# Patient Record
Sex: Female | Born: 1960 | Race: Black or African American | Hispanic: No | Marital: Married | Smoking: Never smoker
Health system: Southern US, Community
[De-identification: ages and names within clinical notes are randomized; demographics above are authoritative.]

## PROBLEM LIST (undated history)

## (undated) DIAGNOSIS — I10 Essential (primary) hypertension: Secondary | ICD-10-CM

## (undated) DIAGNOSIS — T7840XA Allergy, unspecified, initial encounter: Secondary | ICD-10-CM

## (undated) DIAGNOSIS — H269 Unspecified cataract: Secondary | ICD-10-CM

## (undated) DIAGNOSIS — M1990 Unspecified osteoarthritis, unspecified site: Secondary | ICD-10-CM

## (undated) HISTORY — DX: Essential (primary) hypertension: I10

## (undated) HISTORY — PX: BREAST BIOPSY: SHX20

## (undated) HISTORY — PX: TOTAL ABDOMINAL HYSTERECTOMY: SHX209

## (undated) HISTORY — DX: Allergy, unspecified, initial encounter: T78.40XA

## (undated) HISTORY — DX: Unspecified cataract: H26.9

## (undated) HISTORY — DX: Unspecified osteoarthritis, unspecified site: M19.90

---

## 2004-12-06 ENCOUNTER — Encounter: Admission: RE | Admit: 2004-12-06 | Discharge: 2004-12-06 | Payer: Self-pay | Admitting: Internal Medicine

## 2005-12-24 ENCOUNTER — Encounter: Admission: RE | Admit: 2005-12-24 | Discharge: 2005-12-24 | Payer: Self-pay | Admitting: Internal Medicine

## 2007-01-02 ENCOUNTER — Encounter: Admission: RE | Admit: 2007-01-02 | Discharge: 2007-01-02 | Payer: Self-pay | Admitting: Internal Medicine

## 2008-01-08 ENCOUNTER — Encounter: Admission: RE | Admit: 2008-01-08 | Discharge: 2008-01-08 | Payer: Self-pay | Admitting: Internal Medicine

## 2009-01-11 ENCOUNTER — Encounter: Admission: RE | Admit: 2009-01-11 | Discharge: 2009-01-11 | Payer: Self-pay | Admitting: Internal Medicine

## 2009-01-20 ENCOUNTER — Encounter: Admission: RE | Admit: 2009-01-20 | Discharge: 2009-01-20 | Payer: Self-pay | Admitting: Internal Medicine

## 2010-01-13 ENCOUNTER — Encounter: Admission: RE | Admit: 2010-01-13 | Discharge: 2010-01-13 | Payer: Self-pay | Admitting: Internal Medicine

## 2010-10-26 ENCOUNTER — Emergency Department (HOSPITAL_COMMUNITY)
Admission: EM | Admit: 2010-10-26 | Discharge: 2010-10-26 | Payer: Self-pay | Source: Home / Self Care | Admitting: Family Medicine

## 2010-11-26 ENCOUNTER — Encounter: Payer: Self-pay | Admitting: Internal Medicine

## 2011-01-12 ENCOUNTER — Other Ambulatory Visit: Payer: Self-pay | Admitting: Internal Medicine

## 2011-01-12 DIAGNOSIS — Z1231 Encounter for screening mammogram for malignant neoplasm of breast: Secondary | ICD-10-CM

## 2011-01-19 ENCOUNTER — Ambulatory Visit
Admission: RE | Admit: 2011-01-19 | Discharge: 2011-01-19 | Disposition: A | Discharge status: Home / Self Care | Payer: BC Managed Care – PPO | Source: Ambulatory Visit | Attending: Internal Medicine | Admitting: Internal Medicine

## 2011-01-19 DIAGNOSIS — Z1231 Encounter for screening mammogram for malignant neoplasm of breast: Secondary | ICD-10-CM

## 2012-01-31 ENCOUNTER — Other Ambulatory Visit: Payer: Self-pay | Admitting: Internal Medicine

## 2012-01-31 DIAGNOSIS — Z1231 Encounter for screening mammogram for malignant neoplasm of breast: Secondary | ICD-10-CM

## 2012-02-04 ENCOUNTER — Ambulatory Visit
Admission: RE | Admit: 2012-02-04 | Discharge: 2012-02-04 | Disposition: A | Discharge status: Home / Self Care | Payer: BC Managed Care – PPO | Source: Ambulatory Visit | Attending: Internal Medicine | Admitting: Internal Medicine

## 2012-02-04 DIAGNOSIS — Z1231 Encounter for screening mammogram for malignant neoplasm of breast: Secondary | ICD-10-CM

## 2012-05-13 ENCOUNTER — Encounter: Payer: Self-pay | Admitting: Internal Medicine

## 2012-06-12 ENCOUNTER — Ambulatory Visit (AMBULATORY_SURGERY_CENTER): Payer: BC Managed Care – PPO

## 2012-06-12 VITALS — Ht 66.0 in | Wt 230.2 lb

## 2012-06-12 DIAGNOSIS — Z83719 Family history of colon polyps, unspecified: Secondary | ICD-10-CM

## 2012-06-12 DIAGNOSIS — Z1211 Encounter for screening for malignant neoplasm of colon: Secondary | ICD-10-CM

## 2012-06-12 DIAGNOSIS — Z8371 Family history of colonic polyps: Secondary | ICD-10-CM

## 2012-06-12 MED ORDER — MOVIPREP 100 G PO SOLR: ORAL | Status: DC

## 2012-06-13 ENCOUNTER — Encounter: Payer: Self-pay | Admitting: Gastroenterology

## 2012-06-18 ENCOUNTER — Encounter: Payer: BC Managed Care – PPO | Admitting: Gastroenterology

## 2012-06-24 ENCOUNTER — Encounter: Payer: BC Managed Care – PPO | Admitting: Internal Medicine

## 2012-12-10 ENCOUNTER — Encounter: Payer: Self-pay | Admitting: Internal Medicine

## 2013-01-16 ENCOUNTER — Encounter: Payer: Self-pay | Admitting: Internal Medicine

## 2013-01-16 ENCOUNTER — Other Ambulatory Visit: Payer: Self-pay

## 2013-01-16 ENCOUNTER — Ambulatory Visit (AMBULATORY_SURGERY_CENTER): Payer: BC Managed Care – PPO | Admitting: *Deleted

## 2013-01-16 VITALS — Ht 66.0 in | Wt 214.0 lb

## 2013-01-16 DIAGNOSIS — Z1211 Encounter for screening for malignant neoplasm of colon: Secondary | ICD-10-CM

## 2013-01-16 DIAGNOSIS — Z1231 Encounter for screening mammogram for malignant neoplasm of breast: Secondary | ICD-10-CM

## 2013-01-16 MED ORDER — MOVIPREP 100 G PO SOLR: ORAL | Status: DC

## 2013-01-22 ENCOUNTER — Telehealth: Payer: Self-pay | Admitting: Internal Medicine

## 2013-01-22 NOTE — Telephone Encounter (Signed)
Returned call to patient. She reports that she drank some raspberry tea this morning and when she went to pour it out she realized it was red. Informed her just not to drink anymore and that she would be fine to keep her appointment tomorrow.

## 2013-01-23 ENCOUNTER — Encounter: Payer: Self-pay | Admitting: Internal Medicine

## 2013-01-23 ENCOUNTER — Ambulatory Visit (AMBULATORY_SURGERY_CENTER): Payer: BC Managed Care – PPO | Admitting: Internal Medicine

## 2013-01-23 ENCOUNTER — Encounter: Payer: BC Managed Care – PPO | Admitting: Internal Medicine

## 2013-01-23 VITALS — BP 143/79 | HR 72 | Temp 97.5°F | Resp 14 | Ht 66.0 in | Wt 214.0 lb

## 2013-01-23 DIAGNOSIS — Z1211 Encounter for screening for malignant neoplasm of colon: Secondary | ICD-10-CM

## 2013-01-23 MED ORDER — SODIUM CHLORIDE 0.9 % IV SOLN: 500.0000 mL | INTRAVENOUS | Status: DC

## 2013-01-23 NOTE — Patient Instructions (Addendum)
YOU HAD AN ENDOSCOPIC PROCEDURE TODAY AT THE [facility]: Refer to the procedure report that was given to you for any specific questions about what was found during the examination.  If the procedure report does not answer your questions, please call your gastroenterologist to clarify.  If you requested that your care partner not be given the details of your procedure findings, then the procedure report has been included in a sealed envelope for you to review at your convenience later.  YOU SHOULD EXPECT: Some feelings of bloating in the abdomen. Passage of more gas than usual.  Walking can help get rid of the air that was put into your GI tract during the procedure and reduce the bloating. If you had a lower endoscopy (such as a colonoscopy or flexible sigmoidoscopy) you may notice spotting of blood in your stool or on the toilet paper. If you underwent a bowel prep for your procedure, then you may not have a normal bowel movement for a few days.  DIET: Your first meal following the procedure should be a light meal and then it is ok to progress to your normal diet.  A half-sandwich or bowl of soup is an example of a good first meal.  Heavy or fried foods are harder to digest and may make you feel nauseous or bloated.  Likewise meals heavy in dairy and vegetables can cause extra gas to form and this can also increase the bloating.  Drink plenty of fluids but you should avoid alcoholic beverages for 24 hours.  ACTIVITY: Your care partner should take you home directly after the procedure.  You should plan to take it easy, moving slowly for the rest of the day.  You can resume normal activity the day after the procedure however you should NOT DRIVE or use heavy machinery for 24 hours (because of the sedation medicines used during the test).    SYMPTOMS TO REPORT IMMEDIATELY: A gastroenterologist can be reached at any hour.  During normal business hours, 8:30 AM to 5:00 PM Monday through Friday,  call [phone].  After hours and on weekends, please call the GI answering service at [phone] who will take a message and have the physician on call contact you.   Following lower endoscopy (colonoscopy or flexible sigmoidoscopy):  Excessive amounts of blood in the stool  Significant tenderness or worsening of abdominal pains  Swelling of the abdomen that is new, acute  Fever of 100F or higher       FOLLOW UP: If any biopsies were taken you will be contacted by phone or by letter within the next 1-3 weeks.  Call your gastroenterologist if you have not heard about the biopsies in 3 weeks.  Our staff will call the home number listed on your records the next business day following your procedure to check on you and address any questions or concerns that you may have at that time regarding the information given to you following your procedure. This is a courtesy call and so if there is no answer at the home number and we have not heard from you through the emergency physician on call, we will assume that you have returned to your regular daily activities without incident.  SIGNATURES/CONFIDENTIALITY: You and/or your care partner have signed paperwork which will be entered into your electronic medical record.  These signatures attest to the fact that that the information above on your After Visit Summary has been reviewed and is understood.  Full responsibility  of the confidentiality of this discharge information lies with you and/or your care-partner.  Next colonoscopy 10 years  2024  High fiber diet information given.

## 2013-01-23 NOTE — Op Note (Signed)
[facility] [address] [place], [address]   COLONOSCOPY PROCEDURE REPORT  PATIENT: Carrie Hendrix, Carrie Hendrix  MR#: [id number] BIRTHDATE: Jul 29, 1961 , 52  yrs. old GENDER: Female ENDOSCOPIST: Hart Carwin, MD REFERRED BY:  Kellie Shropshire, M.D. PROCEDURE DATE:  01/23/2013 PROCEDURE:   Colonoscopy, screening ASA CLASS:   Class I INDICATIONS:Patient's family history of colon polyps. MEDICATIONS: MAC sedation, administered by CRNA and Propofol (Diprivan) 240 mg IV  DESCRIPTION OF PROCEDURE:   After the risks and benefits and of the procedure were explained, informed consent was obtained.  A digital rectal exam revealed no abnormalities of the rectum.    The endoscope was introduced through the anus and advanced to the cecum, which was identified by both the appendix and ileocecal valve .  The quality of the prep was excellent, using MoviPrep . The instrument was then slowly withdrawn as the colon was fully examined.     COLON FINDINGS: A normal appearing cecum, ileocecal valve, and appendiceal orifice were identified.  The ascending, hepatic flexure, transverse, splenic flexure, descending, sigmoid colon and rectum appeared unremarkable.  No polyps or cancers were seen. Retroflexed views revealed no abnormalities.     The scope was then withdrawn from the patient and the procedure completed.  COMPLICATIONS: There were no complications. ENDOSCOPIC IMPRESSION: Normal colon  RECOMMENDATIONS: High fiber diet   REPEAT EXAM: In 10 year(s)  for Colonoscopy.  cc:  _______________________________ eSignedHart Carwin, MD 01/23/2013 9:04 AM

## 2013-01-23 NOTE — Progress Notes (Signed)
Report to pacu RN, vss, bbs=clear 

## 2013-01-23 NOTE — Progress Notes (Signed)
Patient did not experience any of the following events: a burn prior to discharge; a fall within the facility; wrong site/side/patient/procedure/implant event; or a hospital transfer or hospital admission upon discharge from the facility. (G8907) Patient did not have preoperative order for IV antibiotic SSI prophylaxis. (G8918)  

## 2013-01-26 ENCOUNTER — Telehealth: Payer: Self-pay | Admitting: *Deleted

## 2013-01-26 NOTE — Telephone Encounter (Signed)
  Follow up Call-  Call back number 01/23/2013  Post procedure Call Back phone  # [phone]  Permission to leave phone message Yes     Patient questions:  Message left to call us if necessary.

## 2013-02-09 ENCOUNTER — Ambulatory Visit: Payer: BC Managed Care – PPO

## 2013-02-13 ENCOUNTER — Ambulatory Visit
Admission: RE | Admit: 2013-02-13 | Discharge: 2013-02-13 | Disposition: A | Discharge status: Home / Self Care | Payer: BC Managed Care – PPO | Source: Ambulatory Visit

## 2013-02-13 DIAGNOSIS — Z1231 Encounter for screening mammogram for malignant neoplasm of breast: Secondary | ICD-10-CM

## 2013-02-16 ENCOUNTER — Other Ambulatory Visit: Payer: Self-pay | Admitting: Internal Medicine

## 2013-02-16 DIAGNOSIS — R928 Other abnormal and inconclusive findings on diagnostic imaging of breast: Secondary | ICD-10-CM

## 2013-03-03 ENCOUNTER — Ambulatory Visit
Admission: RE | Admit: 2013-03-03 | Discharge: 2013-03-03 | Disposition: A | Discharge status: Home / Self Care | Payer: BC Managed Care – PPO | Source: Ambulatory Visit | Attending: Internal Medicine | Admitting: Internal Medicine

## 2013-03-03 DIAGNOSIS — R928 Other abnormal and inconclusive findings on diagnostic imaging of breast: Secondary | ICD-10-CM

## 2014-02-08 ENCOUNTER — Other Ambulatory Visit: Payer: Self-pay

## 2014-02-08 DIAGNOSIS — Z1231 Encounter for screening mammogram for malignant neoplasm of breast: Secondary | ICD-10-CM

## 2014-03-04 ENCOUNTER — Ambulatory Visit
Admission: RE | Admit: 2014-03-04 | Discharge: 2014-03-04 | Disposition: A | Discharge status: Home / Self Care | Payer: BC Managed Care – PPO | Source: Ambulatory Visit

## 2014-03-04 ENCOUNTER — Encounter (INDEPENDENT_AMBULATORY_CARE_PROVIDER_SITE_OTHER): Payer: Self-pay

## 2014-03-04 DIAGNOSIS — Z1231 Encounter for screening mammogram for malignant neoplasm of breast: Secondary | ICD-10-CM

## 2015-02-10 ENCOUNTER — Other Ambulatory Visit: Payer: Self-pay

## 2015-02-10 DIAGNOSIS — Z1231 Encounter for screening mammogram for malignant neoplasm of breast: Secondary | ICD-10-CM

## 2015-03-14 ENCOUNTER — Ambulatory Visit: Payer: BC Managed Care – PPO

## 2015-03-25 ENCOUNTER — Ambulatory Visit
Admission: RE | Admit: 2015-03-25 | Discharge: 2015-03-25 | Disposition: A | Discharge status: Home / Self Care | Payer: BC Managed Care – PPO | Source: Ambulatory Visit

## 2015-03-25 DIAGNOSIS — Z1231 Encounter for screening mammogram for malignant neoplasm of breast: Secondary | ICD-10-CM

## 2016-02-23 ENCOUNTER — Other Ambulatory Visit: Payer: Self-pay

## 2016-02-23 DIAGNOSIS — Z1231 Encounter for screening mammogram for malignant neoplasm of breast: Secondary | ICD-10-CM

## 2016-03-26 ENCOUNTER — Ambulatory Visit
Admission: RE | Admit: 2016-03-26 | Discharge: 2016-03-26 | Disposition: A | Discharge status: Home / Self Care | Payer: BC Managed Care – PPO | Source: Ambulatory Visit

## 2016-03-26 DIAGNOSIS — Z1231 Encounter for screening mammogram for malignant neoplasm of breast: Secondary | ICD-10-CM

## 2017-02-20 ENCOUNTER — Other Ambulatory Visit: Payer: Self-pay | Admitting: Internal Medicine

## 2017-02-20 DIAGNOSIS — Z1231 Encounter for screening mammogram for malignant neoplasm of breast: Secondary | ICD-10-CM

## 2017-04-08 ENCOUNTER — Ambulatory Visit
Admission: RE | Admit: 2017-04-08 | Discharge: 2017-04-08 | Disposition: A | Discharge status: Home / Self Care | Payer: BC Managed Care – PPO | Source: Ambulatory Visit | Attending: Internal Medicine | Admitting: Internal Medicine

## 2017-04-08 DIAGNOSIS — Z1231 Encounter for screening mammogram for malignant neoplasm of breast: Secondary | ICD-10-CM

## 2018-05-06 ENCOUNTER — Other Ambulatory Visit: Payer: Self-pay | Admitting: Internal Medicine

## 2018-05-06 DIAGNOSIS — Z1231 Encounter for screening mammogram for malignant neoplasm of breast: Secondary | ICD-10-CM

## 2018-05-26 ENCOUNTER — Ambulatory Visit
Admission: RE | Admit: 2018-05-26 | Discharge: 2018-05-26 | Disposition: A | Discharge status: Home / Self Care | Payer: BC Managed Care – PPO | Source: Ambulatory Visit | Attending: Internal Medicine | Admitting: Internal Medicine

## 2018-05-26 DIAGNOSIS — Z1231 Encounter for screening mammogram for malignant neoplasm of breast: Secondary | ICD-10-CM

## 2019-05-05 ENCOUNTER — Other Ambulatory Visit: Payer: Self-pay | Admitting: Internal Medicine

## 2019-05-05 DIAGNOSIS — Z1231 Encounter for screening mammogram for malignant neoplasm of breast: Secondary | ICD-10-CM

## 2019-06-19 ENCOUNTER — Other Ambulatory Visit: Payer: Self-pay

## 2019-06-19 ENCOUNTER — Ambulatory Visit
Admission: RE | Admit: 2019-06-19 | Discharge: 2019-06-19 | Disposition: A | Discharge status: Home / Self Care | Payer: BC Managed Care – PPO | Source: Ambulatory Visit | Attending: Internal Medicine | Admitting: Internal Medicine

## 2019-06-19 DIAGNOSIS — Z1231 Encounter for screening mammogram for malignant neoplasm of breast: Secondary | ICD-10-CM

## 2020-01-21 ENCOUNTER — Ambulatory Visit: Payer: BC Managed Care – PPO

## 2020-01-28 ENCOUNTER — Ambulatory Visit: Payer: BC Managed Care – PPO | Attending: Family

## 2020-01-28 DIAGNOSIS — Z23 Encounter for immunization: Secondary | ICD-10-CM

## 2020-01-28 NOTE — Progress Notes (Signed)
   Covid-19 Vaccination Clinic  Name:  Jani Moronta    MRN: [id number] DOB: 08-16-1961  01/28/2020  Ms. Shuffield was observed post Covid-19 immunization for 15 minutes without incident. She was provided with Vaccine Information Sheet and instruction to access the V-Safe system.   Ms. Duvall was instructed to call 911 with any severe reactions post vaccine: Marland Kitchen Difficulty breathing  . Swelling of face and throat  . A fast heartbeat  . A bad rash all over body  . Dizziness and weakness   Immunizations Administered    Name Date Dose VIS Date Route   Moderna COVID-19 Vaccine 01/28/2020  3:47 PM 0.5 mL 10/06/2019 Intramuscular   Manufacturer: Moderna   Lot: [id number]   NDC: 53299-242-68

## 2020-03-01 ENCOUNTER — Ambulatory Visit: Payer: BC Managed Care – PPO | Attending: Family

## 2020-03-01 DIAGNOSIS — Z23 Encounter for immunization: Secondary | ICD-10-CM

## 2020-03-01 NOTE — Progress Notes (Signed)
   Covid-19 Vaccination Clinic  Name:  Carrie Hendrix    MRN: [id number] DOB: November 14, 1960  03/01/2020  Ms. [name] was observed post Covid-19 immunization for 15 minutes without incident. She was provided with Vaccine Information Sheet and instruction to access the V-Safe system.   Ms. [name] was instructed to call 911 with any severe reactions post vaccine: Marland Kitchen Difficulty breathing  . Swelling of face and throat  . A fast heartbeat  . A bad rash all over body  . Dizziness and weakness   Immunizations Administered    Name Date Dose VIS Date Route   Moderna COVID-19 Vaccine 03/01/2020  3:33 PM 0.5 mL 10/2019 Intramuscular   Manufacturer: Moderna   Lot: [id number]   NDC: 07225-750-51

## 2020-05-17 ENCOUNTER — Other Ambulatory Visit: Payer: Self-pay | Admitting: Internal Medicine

## 2020-05-17 DIAGNOSIS — Z1231 Encounter for screening mammogram for malignant neoplasm of breast: Secondary | ICD-10-CM

## 2020-06-23 ENCOUNTER — Ambulatory Visit
Admission: RE | Admit: 2020-06-23 | Discharge: 2020-06-23 | Disposition: A | Discharge status: Home / Self Care | Payer: BC Managed Care – PPO | Source: Ambulatory Visit | Attending: Internal Medicine | Admitting: Internal Medicine

## 2020-06-23 DIAGNOSIS — Z1231 Encounter for screening mammogram for malignant neoplasm of breast: Secondary | ICD-10-CM

## 2020-06-24 ENCOUNTER — Ambulatory Visit: Payer: BC Managed Care – PPO

## 2020-09-06 ENCOUNTER — Ambulatory Visit: Payer: BC Managed Care – PPO | Attending: Internal Medicine

## 2020-09-06 DIAGNOSIS — Z23 Encounter for immunization: Secondary | ICD-10-CM

## 2020-09-06 NOTE — Progress Notes (Signed)
   Covid-19 Vaccination Clinic  Name:  Carrie Hendrix    MRN: [id number] DOB: [birth year]  09/06/2020  Carrie Hendrix was observed post Covid-19 immunization for 15 minutes without incident. She was provided with Vaccine Information Sheet and instruction to access the V-Safe system.   Carrie Hendrix was instructed to call 911 with any severe reactions post vaccine: Marland Kitchen Difficulty breathing  . Swelling of face and throat  . A fast heartbeat  . A bad rash all over body  . Dizziness and weakness

## 2021-05-15 ENCOUNTER — Other Ambulatory Visit: Payer: Self-pay | Admitting: Internal Medicine

## 2021-05-15 DIAGNOSIS — Z1231 Encounter for screening mammogram for malignant neoplasm of breast: Secondary | ICD-10-CM

## 2021-06-23 ENCOUNTER — Ambulatory Visit: Payer: Self-pay | Attending: Family

## 2021-06-23 DIAGNOSIS — Z23 Encounter for immunization: Secondary | ICD-10-CM

## 2021-06-23 NOTE — Progress Notes (Signed)
   Covid-19 Vaccination Clinic  Name:  Carrie Hendrix    MRN: [id number] DOB: 28-Oct-1961  06/23/2021  [name] was observed post Covid-19 immunization for 15 minutes without incident. She was provided with Vaccine Information Sheet and instruction to access the V-Safe system.   [name] was instructed to call 911 with any severe reactions post vaccine: Difficulty breathing  Swelling of face and throat  A fast heartbeat  A bad rash all over body  Dizziness and weakness   Immunizations Administered     Name Date Dose VIS Date Route   Moderna Covid-19 Booster Vaccine 06/23/2021  3:00 PM 0.25 mL 08/24/2020 Intramuscular   Manufacturer: Moderna   Lot: [id number]   NDC: 93570-177-93

## 2021-06-27 IMAGING — MG DIGITAL SCREENING BILAT W/ TOMO W/ CAD
8 series · 8 of 24 positions shown · non-contrast
Comparison: Previous exam(s).

CLINICAL DATA: Screening.

EXAM:
DIGITAL SCREENING BILATERAL MAMMOGRAM WITH TOMO AND CAD

[L MLO synth-2D]
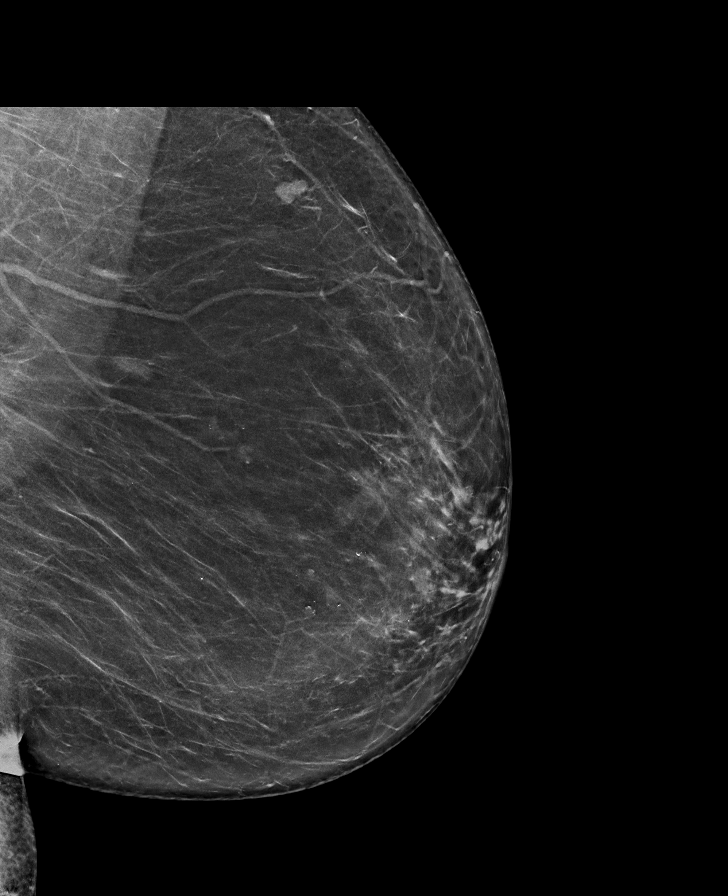

[R MLO synth-2D]
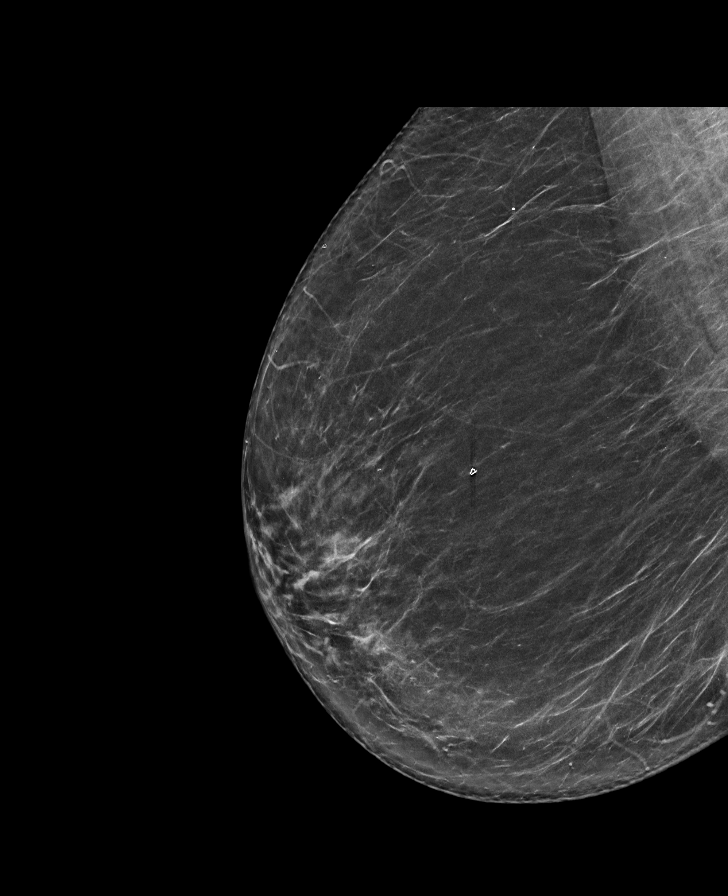

[R CC synth-2D]
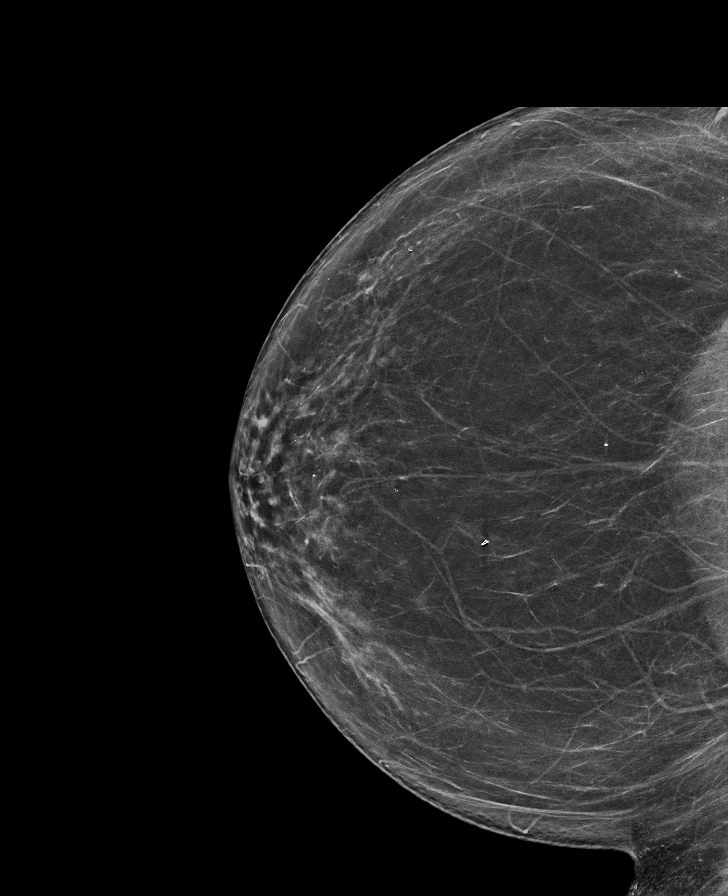

[L CC synth-2D]
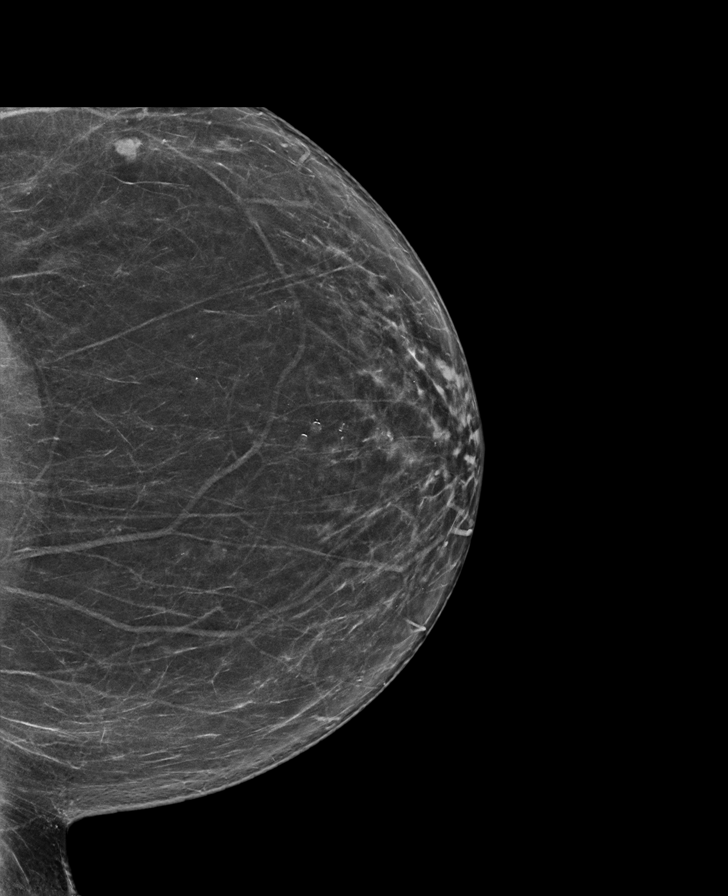

[R CC tomo · tomo slice 40/79.0]
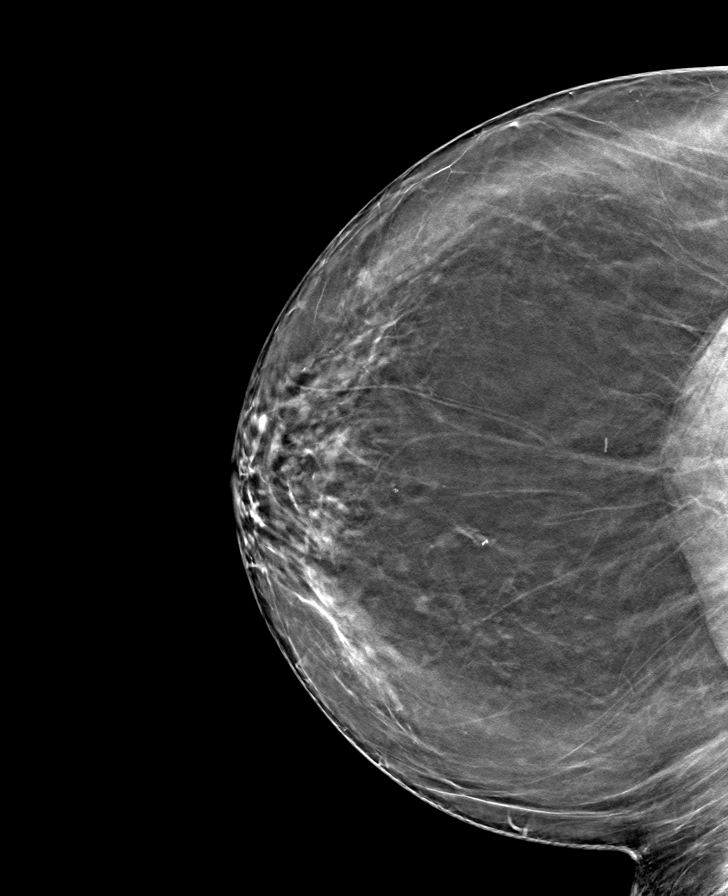

[L CC tomo · tomo slice 41/80.0]
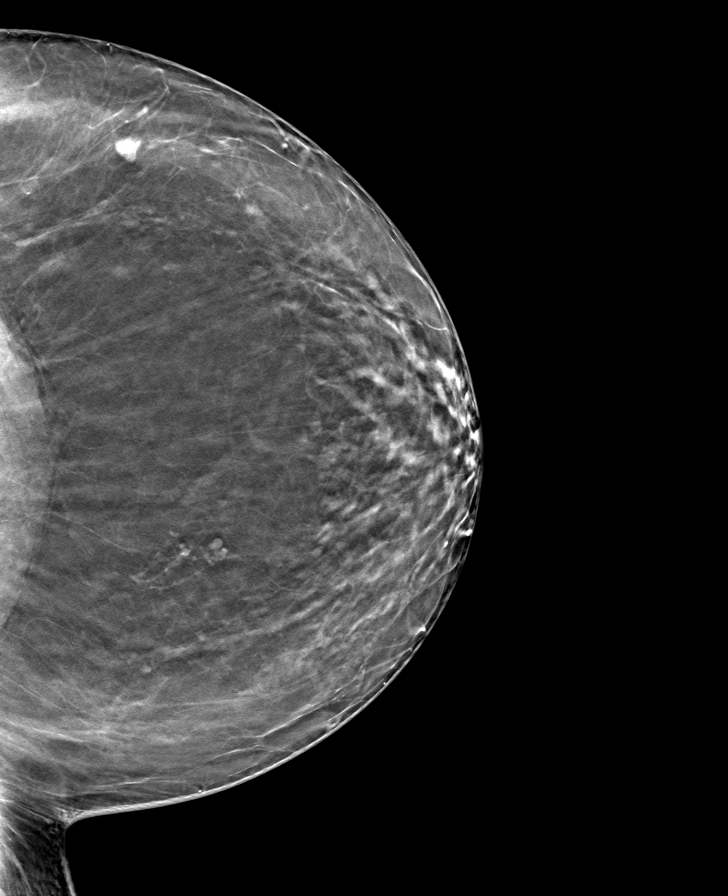

[R MLO tomo · tomo slice 41/81.0]
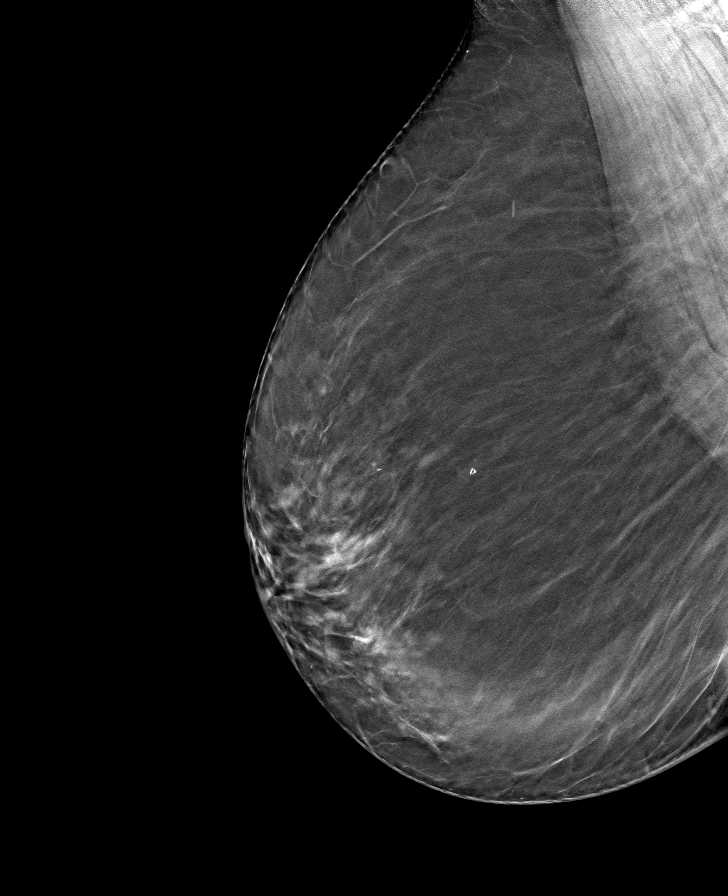

[L MLO tomo · tomo slice 41/81.0]
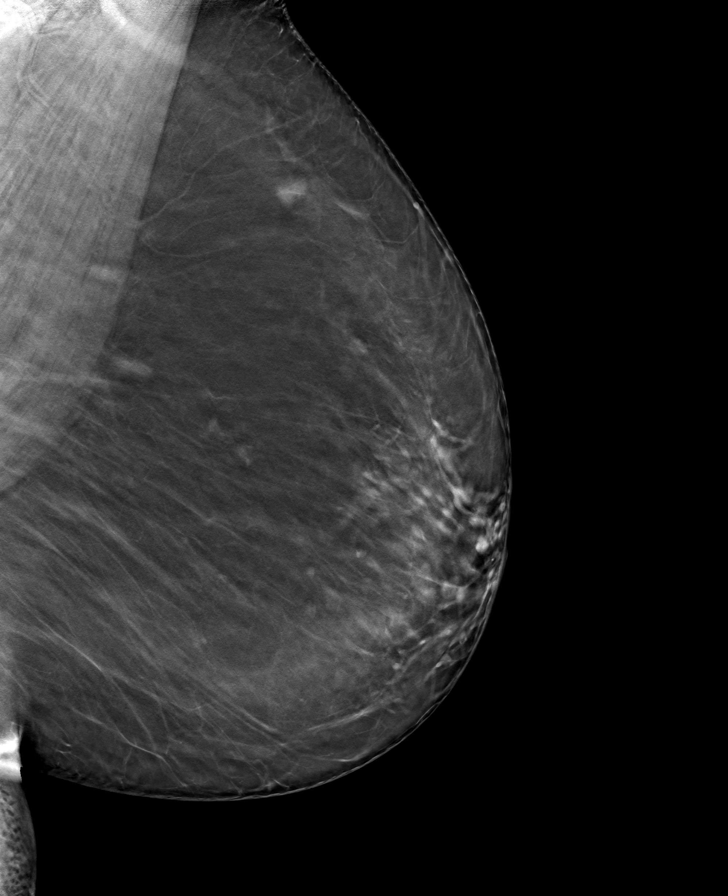

[8 of 24 positions shown; findings below may reference images not displayed]

ACR Breast Density Category b: There are scattered areas of
fibroglandular density.
FINDINGS: There are no findings suspicious for malignancy. Images were
processed with CAD.
IMPRESSION: No mammographic evidence of malignancy. A result letter of this
screening mammogram will be mailed directly to the patient.

RECOMMENDATION:
Screening mammogram in one year. (Code:[id number])

BI-RADS CATEGORY  1: Negative.

## 2021-07-03 ENCOUNTER — Ambulatory Visit: Payer: BC Managed Care – PPO

## 2021-07-06 ENCOUNTER — Ambulatory Visit: Payer: BC Managed Care – PPO

## 2021-07-17 ENCOUNTER — Ambulatory Visit
Admission: RE | Admit: 2021-07-17 | Discharge: 2021-07-17 | Disposition: A | Discharge status: Home / Self Care | Payer: BC Managed Care – PPO | Source: Ambulatory Visit | Attending: Internal Medicine | Admitting: Internal Medicine

## 2021-07-17 ENCOUNTER — Other Ambulatory Visit: Payer: Self-pay

## 2021-07-17 DIAGNOSIS — Z1231 Encounter for screening mammogram for malignant neoplasm of breast: Secondary | ICD-10-CM

## 2022-06-11 ENCOUNTER — Other Ambulatory Visit: Payer: Self-pay | Admitting: Internal Medicine

## 2022-06-11 DIAGNOSIS — Z1231 Encounter for screening mammogram for malignant neoplasm of breast: Secondary | ICD-10-CM

## 2022-07-18 ENCOUNTER — Ambulatory Visit: Payer: BC Managed Care – PPO

## 2022-08-08 ENCOUNTER — Ambulatory Visit
Admission: RE | Admit: 2022-08-08 | Discharge: 2022-08-08 | Disposition: A | Discharge status: Home / Self Care | Payer: BC Managed Care – PPO | Source: Ambulatory Visit | Attending: Internal Medicine | Admitting: Internal Medicine

## 2022-08-08 DIAGNOSIS — Z1231 Encounter for screening mammogram for malignant neoplasm of breast: Secondary | ICD-10-CM

## 2022-11-22 ENCOUNTER — Encounter: Payer: Self-pay | Admitting: Internal Medicine

## 2022-12-28 ENCOUNTER — Ambulatory Visit (AMBULATORY_SURGERY_CENTER): Payer: BC Managed Care – PPO

## 2022-12-28 ENCOUNTER — Other Ambulatory Visit: Payer: Self-pay

## 2022-12-28 VITALS — Ht 66.0 in | Wt 209.0 lb

## 2022-12-28 DIAGNOSIS — Z1211 Encounter for screening for malignant neoplasm of colon: Secondary | ICD-10-CM

## 2022-12-28 MED ORDER — PEG 3350-KCL-NA BICARB-NACL 420 G PO SOLR: 4000.0000 mL | Freq: Once | ORAL | 0 refills | Status: AC

## 2022-12-28 NOTE — Progress Notes (Signed)
Denies allergies to eggs or soy products. Denies complication of anesthesia or sedation. Denies use of weight loss medication. Denies use of O2.   Emmi instructions given for colonoscopy.  

## 2023-01-04 ENCOUNTER — Encounter: Payer: Self-pay | Admitting: Internal Medicine

## 2023-01-25 ENCOUNTER — Encounter: Payer: Self-pay | Admitting: Internal Medicine

## 2023-01-25 ENCOUNTER — Ambulatory Visit (AMBULATORY_SURGERY_CENTER): Payer: BC Managed Care – PPO | Admitting: Internal Medicine

## 2023-01-25 VITALS — BP 114/75 | HR 65 | Temp 97.5°F | Resp 12 | Ht 66.0 in | Wt 211.8 lb

## 2023-01-25 DIAGNOSIS — Z1211 Encounter for screening for malignant neoplasm of colon: Secondary | ICD-10-CM

## 2023-01-25 DIAGNOSIS — D120 Benign neoplasm of cecum: Secondary | ICD-10-CM

## 2023-01-25 DIAGNOSIS — K635 Polyp of colon: Secondary | ICD-10-CM

## 2023-01-25 MED ORDER — SODIUM CHLORIDE 0.9 % IV SOLN: 500.0000 mL | INTRAVENOUS | Status: DC

## 2023-01-25 NOTE — Progress Notes (Signed)
Called to room to assist during endoscopic procedure.  Patient ID and intended procedure confirmed with present staff. Received instructions for my participation in the procedure from the performing physician.  

## 2023-01-25 NOTE — Op Note (Signed)
[facility] Patient Name: Skylei Kendziorski Procedure Date: 01/25/2023 9:01 AM MRN: [id number] Endoscopist: Adline [facility] , , [id number] Age: 62 Referring MD:  Date of Birth: 09/21/1961 Gender: Female Account #: [id number] Procedure:                Colonoscopy Indications:              Screening for colorectal malignant neoplasm Medicines:                Monitored Anesthesia Care Procedure:                Pre-Anesthesia Assessment:                           - Prior to the procedure, a History and Physical                            was performed, and patient medications and                            allergies were reviewed. The patient's tolerance of                            previous anesthesia was also reviewed. The risks                            and benefits of the procedure and the sedation                            options and risks were discussed with the patient.                            All questions were answered, and informed consent                            was obtained. Prior Anticoagulants: The patient has                            taken no anticoagulant or antiplatelet agents. ASA                            Grade Assessment: II - A patient with mild systemic                            disease. After reviewing the risks and benefits,                            the patient was deemed in satisfactory condition to                            undergo the procedure.                           After obtaining informed consent, the colonoscope  was passed under direct vision. Throughout the                            procedure, the patient's blood pressure, pulse, and                            oxygen saturations were monitored continuously. The                            Olympus CF-HQ190L [id number]) Colonoscope was                            introduced through the anus and advanced to the the                            terminal  ileum. The colonoscopy was performed                            without difficulty. The patient tolerated the                            procedure well. The quality of the bowel                            preparation was good. The terminal ileum, ileocecal                            valve, appendiceal orifice, and rectum were                            photographed. Scope In: 9:16:10 AM Scope Out: 9:44:25 AM Scope Withdrawal Time: 0 hours 18 minutes 50 seconds  Total Procedure Duration: 0 hours 28 minutes 15 seconds  Findings:                 The terminal ileum appeared normal.                           Two sessile polyps were found in the cecum. The                            polyps were 3 to 6 mm in size. These polyps were                            removed with a cold snare. Resection and retrieval                            were complete.                           A 2 mm polyp was found in the cecum. The polyp was                            sessile. The polyp was removed with a cold biopsy  forceps. Resection and retrieval were complete.                           Non-bleeding internal hemorrhoids were found during                            retroflexion. Complications:            No immediate complications. Estimated Blood Loss:     Estimated blood loss was minimal. Impression:               - The examined portion of the ileum was normal.                           - Two 3 to 6 mm polyps in the cecum, removed with a                            cold snare. Resected and retrieved.                           - One 2 mm polyp in the cecum, removed with a cold                            biopsy forceps. Resected and retrieved.                           - Non-bleeding internal hemorrhoids. Recommendation:           - Discharge patient to home (with escort).                           - Await pathology results.                           - The findings and  recommendations were discussed                            with the patient. Dr Georgian Co "[place]" [place],  01/25/2023 9:47:59 AM

## 2023-01-25 NOTE — Patient Instructions (Signed)
Resume all of your regular medications today.  Read the material given to y ou by your recovery room nurse.  YOU HAD AN ENDOSCOPIC PROCEDURE TODAY AT [facility]:   Refer to the procedure report that was given to you for any specific questions about what was found during the examination.  If the procedure report does not answer your questions, please call your gastroenterologist to clarify.  If you requested that your care partner not be given the details of your procedure findings, then the procedure report has been included in a sealed envelope for you to review at your convenience later.  YOU SHOULD EXPECT: Some feelings of bloating in the abdomen. Passage of more gas than usual.  Walking can help get rid of the air that was put into your GI tract during the procedure and reduce the bloating. If you had a lower endoscopy (such as a colonoscopy or flexible sigmoidoscopy) you may notice spotting of blood in your stool or on the toilet paper. If you underwent a bowel prep for your procedure, you may not have a normal bowel movement for a few days.  Please Note:  You might notice some irritation and congestion in your nose or some drainage.  This is from the oxygen used during your procedure.  There is no need for concern and it should clear up in a day or so.  SYMPTOMS TO REPORT IMMEDIATELY:  Following lower endoscopy (colonoscopy or flexible sigmoidoscopy):  Excessive amounts of blood in the stool  Significant tenderness or worsening of abdominal pains  Swelling of the abdomen that is new, acute  Fever of 100F or higher  For urgent or emergent issues, a gastroenterologist can be reached at any hour by calling [phone]. Do not use MyChart messaging for urgent concerns.    DIET:  We do recommend a small meal at first, but then you may proceed to your regular diet.  Drink plenty of fluids but you should avoid alcoholic beverages for 24 hours.  Try to increase the fiber in  your diet, and drink plenty of water.  ACTIVITY:  You should plan to take it easy for the rest of today and you should NOT DRIVE or use heavy machinery until tomorrow (because of the sedation medicines used during the test).    FOLLOW UP: Our staff will call the number listed on your records the next business day following your procedure.  We will call around 7:15- 8:00 am to check on you and address any questions or concerns that you may have regarding the information given to you following your procedure. If we do not reach you, we will leave a message.     If any biopsies were taken you will be contacted by phone or by letter within the next 1-3 weeks.  Please call us at [phone] if you have not heard about the biopsies in 3 weeks.    SIGNATURES/CONFIDENTIALITY: You and/or your care partner have signed paperwork which will be entered into your electronic medical record.  These signatures attest to the fact that that the information above on your After Visit Summary has been reviewed and is understood.  Full responsibility of the confidentiality of this discharge information lies with you and/or your care-partner.

## 2023-01-25 NOTE — Progress Notes (Signed)
GASTROENTEROLOGY PROCEDURE H&P NOTE   Primary Care Physician: Willey Blade, MD    Reason for Procedure:   Colon cancer screening  Plan:    Colonoscopy  Patient is appropriate for endoscopic procedure(s) in the ambulatory ([place]) setting.  The nature of the procedure, as well as the risks, benefits, and alternatives were carefully and thoroughly reviewed with the patient. Ample time for discussion and questions allowed. The patient understood, was satisfied, and agreed to proceed.     HPI: Carrie Hendrix is a 62 y.o. female who presents for colonoscopy for colon cancer screening. Denies blood in stools, changes in bowel habits, or unintentional weight loss. Denies family history of colon cancer.  Past Medical History:  Diagnosis Date   Allergy    Arthritis    Cataract    Hypertension     Past Surgical History:  Procedure Laterality Date   BREAST BIOPSY     left/2002   TOTAL ABDOMINAL HYSTERECTOMY     05/08/1999    Prior to Admission medications   Medication Sig Start Date End Date Taking? Authorizing Provider  Ascorbic Acid (VITAMIN C) 1000 MG tablet Take 1,000 mg by mouth daily. Take 1-2 tabs bid   Yes [provider]  cetirizine (ZYRTEC) 10 MG tablet Take 10 mg by mouth daily.   Yes [provider]  Cholecalciferol (VITAMIN D-3 PO) Take by mouth daily.   Yes [provider]  losartan-hydrochlorothiazide (HYZAAR) 100-25 MG per tablet Take 1 tablet by mouth daily.   Yes [provider]  Multiple Vitamin (ONE-DAILY MULTI VITAMINS PO) Take by mouth. Womens One A Day MVI-Take daily   Yes [provider]  OVER THE COUNTER MEDICATION Pro-biotic capsule daily.   Yes [provider]  OVER THE COUNTER MEDICATION B-Complex vitamin one daily.   Yes [provider]  OVER THE COUNTER MEDICATION Vitamin K, one capsule daily.   Yes [provider]  OVER THE COUNTER MEDICATION Co-Q 10, One capsule daily.    Yes [provider]  OVER THE COUNTER MEDICATION Red Yeast Rice, two daily.   Yes [provider]  OVER THE COUNTER MEDICATION Berine one capsule daily.   Yes [provider]    Current Outpatient Medications  Medication Sig Dispense Refill   Ascorbic Acid (VITAMIN C) 1000 MG tablet Take 1,000 mg by mouth daily. Take 1-2 tabs bid     cetirizine (ZYRTEC) 10 MG tablet Take 10 mg by mouth daily.     Cholecalciferol (VITAMIN D-3 PO) Take by mouth daily.     losartan-hydrochlorothiazide (HYZAAR) 100-25 MG per tablet Take 1 tablet by mouth daily.     Multiple Vitamin (ONE-DAILY MULTI VITAMINS PO) Take by mouth. Womens One A Day MVI-Take daily     OVER THE COUNTER MEDICATION Pro-biotic capsule daily.     OVER THE COUNTER MEDICATION B-Complex vitamin one daily.     OVER THE COUNTER MEDICATION Vitamin K, one capsule daily.     OVER THE COUNTER MEDICATION Co-Q 10, One capsule daily.     OVER THE COUNTER MEDICATION Red Yeast Rice, two daily.     OVER THE COUNTER MEDICATION Berine one capsule daily.     Current Facility-Administered Medications  Medication Dose Route Frequency Provider Last Rate Last Admin   0.9 %  sodium chloride infusion  500 mL Intravenous Continuous Sharyn Creamer, MD        Allergies as of 01/25/2023 - Review Complete 01/25/2023  Allergen Reaction Noted  Soybean-containing drug products Hives, Itching, and Swelling 12/28/2022    Family History  Problem Relation Age of Onset   Colon polyps Father    Heart disease Sister    Breast cancer Cousin    Breast cancer Cousin    Colon cancer Neg Hx    Esophageal cancer Neg Hx    Stomach cancer Neg Hx    Rectal cancer Neg Hx     Social History   Socioeconomic History   Marital status: Married    Spouse name: Not on file   Number of children: Not on file   Years of education: Not on file   Highest education level: Not on file  Occupational History   Not on file  Tobacco Use   Smoking  status: Never   Smokeless tobacco: Never  Vaping Use   Vaping Use: Never used  Substance and Sexual Activity   Alcohol use: No   Drug use: No   Sexual activity: Not on file  Other Topics Concern   Not on file  Social History Narrative   Not on file   Social Determinants of Health   Financial Resource Strain: Not on file  Food Insecurity: Not on file  Transportation Needs: Not on file  Physical Activity: Not on file  Stress: Not on file  Social Connections: Not on file  Intimate Partner Violence: Not on file    Physical Exam: Vital signs in last 24 hours: BP (!) 163/100 (BP Location: Right Arm)   Pulse 74   Temp (!) 97.5 F (36.4 C)   Ht 5\' 6"  (1.676 m)   Wt 211 lb 12.8 oz (96.1 kg)   SpO2 98%   BMI 34.19 kg/m  GEN: NAD EYE: Sclerae anicteric ENT: MMM CV: Non-tachycardic Pulm: No increased work of breathing GI: Soft, NT/ND NEURO:  Alert & Oriented   Christia Reading, MD [facility]  01/25/2023 8:57 AM

## 2023-01-25 NOTE — Progress Notes (Signed)
Vss nad trans to pacu 

## 2023-01-28 ENCOUNTER — Telehealth: Payer: Self-pay

## 2023-01-28 NOTE — Telephone Encounter (Signed)
Attempted to reach patient for post-procedure f/u call. No answer. Left message for her to please not hesitate to call if she has any questions/concerns regarding her care. 

## 2023-01-30 ENCOUNTER — Encounter: Payer: Self-pay | Admitting: Internal Medicine

## 2023-02-12 ENCOUNTER — Telehealth: Payer: Self-pay | Admitting: Internal Medicine

## 2023-02-12 NOTE — Telephone Encounter (Signed)
Inbound call from patient, is requesting follow up on pathology results from 3/22.

## 2023-02-13 NOTE — Telephone Encounter (Signed)
Spoke with the patient. Reviewed her pathology letter. No questions at the end of the phone call.

## 2023-06-24 ENCOUNTER — Encounter: Payer: Self-pay | Admitting: Internal Medicine

## 2023-06-24 ENCOUNTER — Ambulatory Visit: Payer: BC Managed Care – PPO | Attending: Internal Medicine | Admitting: Internal Medicine

## 2023-06-24 VITALS — BP 138/84 | HR 67 | Ht 66.0 in | Wt 220.0 lb

## 2023-06-24 DIAGNOSIS — Z7689 Persons encountering health services in other specified circumstances: Secondary | ICD-10-CM

## 2023-06-24 DIAGNOSIS — I10 Essential (primary) hypertension: Secondary | ICD-10-CM | POA: Diagnosis not present

## 2023-06-24 DIAGNOSIS — Z6835 Body mass index (BMI) 35.0-35.9, adult: Secondary | ICD-10-CM

## 2023-06-24 NOTE — Patient Instructions (Signed)
If you get an opportunity to, please check pressure at least once a week and record the readings.  Bring it with you when you come to see our clinical pharmacist in 1 month.  DASH Eating Plan DASH stands for Dietary Approaches to Stop Hypertension. The DASH eating plan is a healthy eating plan that has been shown to: Lower high blood pressure (hypertension). Reduce your risk for type 2 diabetes, heart disease, and stroke. Help with weight loss. What are tips for following this plan? Reading food labels Check food labels for the amount of salt (sodium) per serving. Choose foods with less than 5 percent of the Daily Value (DV) of sodium. In general, foods with less than 300 milligrams (mg) of sodium per serving fit into this eating plan. To find whole grains, look for the word "whole" as the first word in the ingredient list. Shopping Buy products labeled as "low-sodium" or "no salt added." Buy fresh foods. Avoid canned foods and pre-made or frozen meals. Cooking Try not to add salt when you cook. Use salt-free seasonings or herbs instead of table salt or sea salt. Check with your health care provider or pharmacist before using salt substitutes. Do not fry foods. Cook foods in healthy ways, such as baking, boiling, grilling, roasting, or broiling. Cook using oils that are good for your heart. These include olive, canola, avocado, soybean, and sunflower oil. Meal planning  Eat a balanced diet. This should include: 4 or more servings of fruits and 4 or more servings of vegetables each day. Try to fill half of your plate with fruits and vegetables. 6-8 servings of whole grains each day. 6 or less servings of lean meat, poultry, or fish each day. 1 oz is 1 serving. A 3 oz (85 g) serving of meat is about the same size as the palm of your hand. One egg is 1 oz (28 g). 2-3 servings of low-fat dairy each day. One serving is 1 cup (237 mL). 1 serving of nuts, seeds, or beans 5 times each week. 2-3  servings of heart-healthy fats. Healthy fats called omega-3 fatty acids are found in foods such as walnuts, flaxseeds, fortified milks, and eggs. These fats are also found in cold-water fish, such as sardines, salmon, and mackerel. Limit how much you eat of: Canned or prepackaged foods. Food that is high in trans fat, such as fried foods. Food that is high in saturated fat, such as fatty meat. Desserts and other sweets, sugary drinks, and other foods with added sugar. Full-fat dairy products. Do not salt foods before eating. Do not eat more than 4 egg yolks a week. Try to eat at least 2 vegetarian meals a week. Eat more home-cooked food and less restaurant, buffet, and fast food. Lifestyle When eating at a restaurant, ask if your food can be made with less salt or no salt. If you drink alcohol: Limit how much you have to: 0-1 drink a day if you are female. 0-2 drinks a day if you are female. Know how much alcohol is in your drink. In the [place] one drink is one 12 oz bottle of beer (355 mL), one 5 oz glass of wine (148 mL), or one 1 oz glass of hard liquor (44 mL). General information Avoid eating more than 2,300 mg of salt a day. If you have hypertension, you may need to reduce your sodium intake to 1,500 mg a day. Work with your provider to stay at a healthy body weight or lose weight.  Ask what the best weight range is for you. On most days of the week, get at least 30 minutes of exercise that causes your heart to beat faster. This may include walking, swimming, or biking. Work with your provider or dietitian to adjust your eating plan to meet your specific calorie needs. What foods should I eat? Fruits All fresh, dried, or frozen fruit. Canned fruits that are in their natural juice and do not have sugar added to them. Vegetables Fresh or frozen vegetables that are raw, steamed, roasted, or grilled. Low-sodium or reduced-sodium tomato and vegetable juice. Low-sodium or reduced-sodium  tomato sauce and tomato paste. Low-sodium or reduced-sodium canned vegetables. Grains Whole-grain or whole-wheat bread. Whole-grain or whole-wheat pasta. Brown rice. Orpah Cobb. Bulgur. Whole-grain and low-sodium cereals. Pita bread. Low-fat, low-sodium crackers. Whole-wheat flour tortillas. Meats and other proteins Skinless chicken or [place]. Ground chicken or [place]. Pork with fat trimmed off. Fish and seafood. Egg whites. Dried beans, peas, or lentils. Unsalted nuts, nut butters, and seeds. Unsalted canned beans. Lean cuts of beef with fat trimmed off. Low-sodium, lean precooked or cured meat, such as sausages or meat loaves. Dairy Low-fat (1%) or fat-free (skim) milk. Reduced-fat, low-fat, or fat-free cheeses. Nonfat, low-sodium ricotta or cottage cheese. Low-fat or nonfat yogurt. Low-fat, low-sodium cheese. Fats and oils Soft margarine without trans fats. Vegetable oil. Reduced-fat, low-fat, or light mayonnaise and salad dressings (reduced-sodium). Canola, safflower, olive, avocado, soybean, and sunflower oils. Avocado. Seasonings and condiments Herbs. Spices. Seasoning mixes without salt. Other foods Unsalted popcorn and pretzels. Fat-free sweets. The items listed above may not be all the foods and drinks you can have. Talk to a dietitian to learn more. What foods should I avoid? Fruits Canned fruit in a light or heavy syrup. Fried fruit. Fruit in cream or butter sauce. Vegetables Creamed or fried vegetables. Vegetables in a cheese sauce. Regular canned vegetables that are not marked as low-sodium or reduced-sodium. Regular canned tomato sauce and paste that are not marked as low-sodium or reduced-sodium. Regular tomato and vegetable juices that are not marked as low-sodium or reduced-sodium. Rosita Fire. Olives. Grains Baked goods made with fat, such as croissants, muffins, or some breads. Dry pasta or rice meal packs. Meats and other proteins Fatty cuts of meat. Ribs. Fried meat.  Tomasa Blase. Bologna, salami, and other precooked or cured meats, such as sausages or meat loaves, that are not lean and low in sodium. Fat from the back of a pig (fatback). Bratwurst. Salted nuts and seeds. Canned beans with added salt. Canned or smoked fish. Whole eggs or egg yolks. Chicken or [place] with skin. Dairy Whole or 2% milk, cream, and half-and-half. Whole or full-fat cream cheese. Whole-fat or sweetened yogurt. Full-fat cheese. Nondairy creamers. Whipped toppings. Processed cheese and cheese spreads. Fats and oils Butter. Stick margarine. Lard. Shortening. Ghee. Bacon fat. Tropical oils, such as coconut, palm kernel, or palm oil. Seasonings and condiments Onion salt, garlic salt, seasoned salt, table salt, and sea salt. Worcestershire sauce. Tartar sauce. Barbecue sauce. Teriyaki sauce. Soy sauce, including reduced-sodium soy sauce. Steak sauce. Canned and packaged gravies. Fish sauce. Oyster sauce. Cocktail sauce. Store-bought horseradish. Ketchup. Mustard. Meat flavorings and tenderizers. Bouillon cubes. Hot sauces. Pre-made or packaged marinades. Pre-made or packaged taco seasonings. Relishes. Regular salad dressings. Other foods Salted popcorn and pretzels. The items listed above may not be all the foods and drinks you should avoid. Talk to a dietitian to learn more. Where to find more information National Heart, Lung, and Blood Institute (NHLBI):  BuffaloDryCleaner.gl American Heart Association (AHA): heart.org Academy of Nutrition and Dietetics: eatright.org National Kidney Foundation (NKF): kidney.org This information is not intended to replace advice given to you by your health care provider. Make sure you discuss any questions you have with your health care provider. Document Revised: 11/08/2022 Document Reviewed: 11/08/2022 Elsevier Patient Education  2024 [employer].

## 2023-06-24 NOTE — Progress Notes (Signed)
Patient ID: Carrie Hendrix, female    DOB: 1961-09-18  MRN: [id number]  CC: Establish Care (Est care new pt. /Pt requested for medical records to be sent )   Subjective: Carrie Hendrix is a 62 y.o. female who presents for new pt visit Her concerns today include:  Pt with hx of HTN, cataracts, allergies, arthritis, obesity  Previous PCP was Dr. Andi Devon with [facility].  She decided to change because it was concierge service with monthly fee.  Insurance has changed and nearing retirement so decided to change.  HTN: on hydrochlorothiazide 25 mg and Diovan 80 mg  Has machine at home but not accurate Limits salt No CP/SOB/LE edema.  Swelling in fingers and feet sometimes Allergies:  takes Zyrtec every night.  Also uses OTC Equate Saline nasal spray PRN.  Allergic to dust and several other things  Obesity: use to walk 30 mins at lunch every day Developed arthritis in RT knee; told by ortho to walk no more than 5 mins Join [employer] 6-7 mths ago and does water aerobic 2x/wk; 3 times a wk bicycle and eliptical and some wghs Only meat is fish; lots of fruits and veggies. Does not eat white bread/rice; wheat bread and brown rice instead.  Drinks coffee with no sugar, water, occasional.  Saw nutritionist in past when HL was high but it improved.  Feels eating habits not poor but could do better Obesity runs in family  HM:  had hysterectomy in 1990s due to precancerous cells. PAPs since have been normal.  No more needed.  Never received flu vaccine.  Had COVID-19 vaccines 4 shots.  Uptodate with Shingrix and tdapt.      Current Outpatient Medications on File Prior to Visit  Medication Sig Dispense Refill   Ascorbic Acid (VITAMIN C) 1000 MG tablet Take 1,000 mg by mouth daily. Take 1-2 tabs bid     cetirizine (ZYRTEC) 10 MG tablet Take 10 mg by mouth daily.     hydrochlorothiazide (HYDRODIURIL) 25 MG tablet Take 25 mg by mouth daily.     Multiple Vitamin (ONE-DAILY MULTI VITAMINS PO) Take by  mouth. Womens One A Day MVI-Take daily     valsartan (DIOVAN) 80 MG tablet Take 80 mg by mouth daily.     No current facility-administered medications on file prior to visit.    Allergies  Allergen Reactions   Soybean-Containing Drug Products Hives, Itching and Swelling    Social History   Socioeconomic History   Marital status: Married    Spouse name: Not on file   Number of children: 1   Years of education: Not on file   Highest education level: Bachelor's degree (e.g., BA, AB, BS)  Occupational History   Occupation: Lexicographer at [employer]  Tobacco Use   Smoking status: Never   Smokeless tobacco: Never  Vaping Use   Vaping status: Never Used  Substance and Sexual Activity   Alcohol use: No   Drug use: No   Sexual activity: Not on file  Other Topics Concern   Not on file  Social History Narrative   Pt reports 1 daughter    Social Determinants of Health   Financial Resource Strain: Not on file  Food Insecurity: Not on file  Transportation Needs: Not on file  Physical Activity: Not on file  Stress: Not on file  Social Connections: Not on file  Intimate Partner Violence: Not on file    Family History  Problem Relation Age of Onset  Colon polyps Father    Heart disease Sister    Breast cancer Cousin    Breast cancer Cousin    Colon cancer Neg Hx    Esophageal cancer Neg Hx    Stomach cancer Neg Hx    Rectal cancer Neg Hx     Past Surgical History:  Procedure Laterality Date   BREAST BIOPSY     left/2002   TOTAL ABDOMINAL HYSTERECTOMY     05/08/1999    ROS: Review of Systems Negative except as stated above  PHYSICAL EXAM: BP 138/84 (BP Location: Left Arm, Patient Position: Sitting, Cuff Size: Normal)   Pulse 67   Ht 5\' 6"  (1.676 m)   Wt 220 lb (99.8 kg)   SpO2 100%   BMI 35.51 kg/m   Wt Readings from Last 3 Encounters:  06/24/23 220 lb (99.8 kg)  01/25/23 211 lb 12.8 oz (96.1 kg)  12/28/22 209 lb (94.8 kg)    Physical Exam  General  appearance - alert, well appearing, older African-American female and in no distress Mental status - normal mood, behavior, speech, dress, motor activity, and thought processes Eyes -Pink conjunctiva Neck - supple, no significant adenopathy Chest - clear to auscultation, no wheezes, rales or rhonchi, symmetric air entry Heart - normal rate, regular rhythm, normal S1, S2, no murmurs, rubs, clicks or gallops Extremities - peripheral pulses normal, no pedal edema, no clubbing or cyanosis MSK right knee: Good range of motion      No data to display          ASSESSMENT AND PLAN:  1. Establishing care with new doctor, encounter for   2. Essential hypertension Blood pressure not at goal which is 130/80 or lower. Encouraged her to continue DASH diet. We discussed increasing Diovan from 80 to 160 mg daily versus continuing the 80 mg with the hydrochlorothiazide 25 mg daily then follow-up with clinical pharmacist in 1 month for recheck.  Patient prefers to follow-up with clinical pharmacist.  Advised to check blood pressure at least about once a week, record readings and bring those readings with her on visit with clinical pharmacist. - CBC - Comprehensive metabolic panel - Lipid panel  3. Class 2 severe obesity due to excess calories with serious comorbidity and body mass index (BMI) of 35.0 to 35.9 in adult [facility]) Patient advised to eliminate sugary drinks from the diet, cut back on portion sizes especially of white carbohydrates, eat more white lean meat like chicken [place] and seafood instead of beef or pork and incorporate fresh fruits and vegetables into the diet daily. Encouraged her to continue regular exercise as she has been doing. - CBC - Lipid panel   Patient was given the opportunity to ask questions.  Patient verbalized understanding of the plan and was able to repeat key elements of the plan.   This documentation was completed using Paediatric nurse.  Any  transcriptional errors are unintentional.  Orders Placed This Encounter  Procedures   CBC   Comprehensive metabolic panel   Lipid panel     Requested Prescriptions    No prescriptions requested or ordered in this encounter    Return in about 4 months (around 10/24/2023) for BP check Luke in 4 wks.  Sign release to get records from prior PCP.  Jonah Blue, MD, FACP

## 2023-06-25 LAB — COMPREHENSIVE METABOLIC PANEL
ALT: 25 IU/L (ref 0–32)
AST: 23 IU/L (ref 0–40)
Albumin: 4.2 g/dL (ref 3.9–4.9)
Alkaline Phosphatase: 100 IU/L (ref 44–121)
BUN/Creatinine Ratio: 15 (ref 12–28)
BUN: 10 mg/dL (ref 8–27)
Bilirubin Total: 0.7 mg/dL (ref 0.0–1.2)
CO2: 23 mmol/L (ref 20–29)
Calcium: 9.5 mg/dL (ref 8.7–10.3)
Chloride: 103 mmol/L (ref 96–106)
Creatinine, Ser: 0.67 mg/dL (ref 0.57–1.00)
Globulin, Total: 3 g/dL (ref 1.5–4.5)
Glucose: 95 mg/dL (ref 70–99)
Potassium: 3.6 mmol/L (ref 3.5–5.2)
Sodium: 142 mmol/L (ref 134–144)
Total Protein: 7.2 g/dL (ref 6.0–8.5)
eGFR: 99 mL/min/{1.73_m2} (ref >59)

## 2023-06-25 LAB — LIPID PANEL
Chol/HDL Ratio: 3.1 ratio (ref 0.0–4.4)
Cholesterol, Total: 223 mg/dL — ABNORMAL HIGH (ref 100–199)
HDL: 71 mg/dL (ref >39)
LDL Chol Calc (NIH): 136 mg/dL — ABNORMAL HIGH (ref 0–99)
Triglycerides: 89 mg/dL (ref 0–149)
VLDL Cholesterol Cal: 16 mg/dL (ref 5–40)

## 2023-06-25 LAB — CBC
Hematocrit: 41.9 % (ref 34.0–46.6)
Hemoglobin: 13.5 g/dL (ref 11.1–15.9)
MCH: 27.4 pg (ref 26.6–33.0)
MCHC: 32.2 g/dL (ref 31.5–35.7)
MCV: 85 fL (ref 79–97)
Platelets: 239 10*3/uL (ref 150–450)
RBC: 4.92 x10E6/uL (ref 3.77–5.28)
RDW: 12.9 % (ref 11.7–15.4)
WBC: 4.6 10*3/uL (ref 3.4–10.8)

## 2023-07-01 ENCOUNTER — Other Ambulatory Visit: Payer: Self-pay | Admitting: Internal Medicine

## 2023-07-01 DIAGNOSIS — Z1231 Encounter for screening mammogram for malignant neoplasm of breast: Secondary | ICD-10-CM

## 2023-07-10 ENCOUNTER — Encounter: Payer: Self-pay | Admitting: Internal Medicine

## 2023-07-10 NOTE — Progress Notes (Signed)
Records received from her previous PCP Dr. Renae Gloss.  Diagnoses include HTN, HL, vitamin D deficiency, obesity.  Patient also had prediabetes with A1c of 6.1 on 09/2021.  Tdap was administered 09/05/2015.

## 2023-07-25 ENCOUNTER — Ambulatory Visit: Payer: BC Managed Care – PPO | Admitting: Pharmacist

## 2023-07-29 ENCOUNTER — Ambulatory Visit: Payer: BC Managed Care – PPO | Attending: Internal Medicine | Admitting: Pharmacist

## 2023-07-29 ENCOUNTER — Encounter: Payer: Self-pay | Admitting: Pharmacist

## 2023-07-29 VITALS — BP 145/85 | HR 66

## 2023-07-29 DIAGNOSIS — I10 Essential (primary) hypertension: Secondary | ICD-10-CM | POA: Diagnosis not present

## 2023-07-29 MED ORDER — HYDROCHLOROTHIAZIDE 25 MG PO TABS: 25.0000 mg | ORAL_TABLET | Freq: Every day | ORAL | 0 refills | Status: DC

## 2023-07-29 MED ORDER — VALSARTAN 160 MG PO TABS: 160.0000 mg | ORAL_TABLET | Freq: Every day | ORAL | 1 refills | Status: DC

## 2023-07-29 NOTE — Progress Notes (Signed)
S:     No chief complaint on file.  62 y.o. female who presents for hypertension evaluation, education, and management.  PMH is significant for HTN, obesity, .  Patient was referred and last seen by Primary Care Provider, Dr. Laural Benes, on 8/19.   At last visit, BP was elevated at 138/84. Discussed increasing valsartan to 160 mg, however patient preferred to wait and follow up with pharmacist. She reported having a home BP cuff, but that it was not reading accurately.   Today, patient arrives in good spirits and presents without assistance. Denies dizziness, headache, blurred vision, swelling. Reports using her husband's BP cuff, but kept receiving error message. [facility] with values noted below. Reports that she needs refills on valsartan and hydrochlorothiazide today - will run out before her next scheduled visit with Dr. Laural Benes.  Family/Social history: sister with heart disease; no tobacco use, no alcohol use  Medication adherence reported . Patient took BP medications last night.  Current antihypertensives include: hydrochlorothiazide 25 mg daily (10 PM), valsartan 80 mg daily (10 PM)  Reported home BP readings:  [facility] 7 PM: 123/82, 124/75  [facility] 9/12 at 5:30 :139/88 9/19 at 6:12 PM : 150/92  Patient reported dietary habits: Eats 2-3 meals/day Breakfast: steel cut oats every morning with mixed nuts, frozen blueberries, dried cranberries, coffee with creamer and no sugar Lunch: salad with baked salmon and nuts, wheat crackers, grapes Dinner: air fryer seafood, broccoli, asparagus with mushrooms and onions, cabbage, kale, red peppers Uses mrs. Dash, does not add salt to food Drinks herbal tea  Patient-reported exercise habits: walks daily, uses stationery elliptical, water aerobics  ASCVD risk factors include: HTN  O:  ROS  Physical Exam  Last 3 Office BP readings: BP Readings from Last 3 Encounters:  07/29/23 (!) 145/85   06/24/23 138/84  01/25/23 114/75    BMET    Component Value Date/Time   NA 142 06/24/2023 1023   K 3.6 06/24/2023 1023   CL 103 06/24/2023 1023   CO2 23 06/24/2023 1023   GLUCOSE 95 06/24/2023 1023   BUN 10 06/24/2023 1023   CREATININE 0.67 06/24/2023 1023   CALCIUM 9.5 06/24/2023 1023    Renal function: CrCl cannot be calculated (Patient's most recent lab result is older than the maximum 21 days allowed.).  Clinical ASCVD: No  The 10-year ASCVD risk score (Arnett DK, et al., 2019) is: 9.9%   Values used to calculate the score:     Age: 82 years     Sex: Female     Is Non-Hispanic African American: Yes     Diabetic: No     Tobacco smoker: No     Systolic Blood Pressure: 145 mmHg     Is BP treated: Yes     HDL Cholesterol: 71 mg/dL     Total Cholesterol: 223 mg/dL  Patient is participating in a Managed Medicaid Plan:  No   A/P: Hypertension longstanding currently uncontrolled on current medications. BP goal < 130/80 mmHg. Medication adherence appears optimal. Control is suboptimal due to need for additional therapy. Commended her on healthy diet and exercise and encouraged her to continue this. -Increased dose of valsartan to 160 mg daily. Instructed her to take 2 of the 80 mg tablets daily to use up current supply then pick up increased dose. -Continued hydrochlorothiazide 25 mg daily. - [facility] -Patient educated on purpose, proper use, and potential  adverse effects of valsartan and hydrochlorothiazide.  -F/u labs ordered - BMP due at next visit in 1 month -Counseled on lifestyle modifications for blood pressure control including reduced dietary sodium, increased exercise, adequate sleep. -Encouraged patient to check BP at home and bring log of readings to next visit. Counseled on proper use of home BP cuff.   Results reviewed and written information provided.    Written patient instructions provided. Patient verbalized understanding of treatment  plan.  Total time in face to face counseling 30 minutes.    Follow-up:  Pharmacist in 1 month. PCP clinic visit in December.    Jarrett Ables, PharmD PGY-1 Pharmacy Resident

## 2023-08-12 ENCOUNTER — Ambulatory Visit
Admission: RE | Admit: 2023-08-12 | Discharge: 2023-08-12 | Disposition: A | Discharge status: Home / Self Care | Payer: BC Managed Care – PPO | Source: Ambulatory Visit | Attending: Internal Medicine | Admitting: Internal Medicine

## 2023-08-12 DIAGNOSIS — Z1231 Encounter for screening mammogram for malignant neoplasm of breast: Secondary | ICD-10-CM

## 2023-08-31 ENCOUNTER — Telehealth: Payer: Self-pay | Admitting: Internal Medicine

## 2023-08-31 NOTE — Telephone Encounter (Signed)
Copied from CRM [id number]. Topic: General - Other >> Aug 30, 2023  8:32 AM Phill Myron wrote: Reason for CRM:  Clarification is needed for recent charge.Marland KitchenMarland KitchenMs Rosine has spoken with billing and her insurance company. Marland Kitchen.She is questioning why she being charged so much for an office visit.  She would also like to know if this is the way she is going to be billed then she would like to cancel her future appointments. Please advise.

## 2023-09-02 ENCOUNTER — Ambulatory Visit: Payer: BC Managed Care – PPO | Admitting: Pharmacist

## 2023-10-24 ENCOUNTER — Encounter: Payer: BC Managed Care – PPO | Admitting: Internal Medicine

## 2023-10-24 ENCOUNTER — Encounter: Payer: Self-pay | Admitting: Internal Medicine

## 2023-11-08 ENCOUNTER — Telehealth: Payer: Self-pay | Admitting: Internal Medicine

## 2023-11-08 NOTE — Telephone Encounter (Signed)
 Copied from CRM [id number]. Topic: General - Inquiry >> Nov 08, 2023  1:22 PM Donnal HERO wrote: Reason for CRM: Milinda from [facility] is calling regarding the date of service, 06/24/2023. Stated she is calling on behalf of the patient to follow up, as the patient was told a correction would be submitted, but [employer] never received anything. Pt has spoken with [facility] billing department.  [place] stated the patient has a deductible and co-insurance. Pt is being charged as an outpatient instead of an office-based facility. Please make corrections. If there is an issue with service discrepancy.  Please follow up with the patient directly.

## 2023-12-06 ENCOUNTER — Other Ambulatory Visit: Payer: Self-pay | Admitting: Internal Medicine

## 2023-12-06 NOTE — Telephone Encounter (Signed)
Requested Prescriptions  Pending Prescriptions Disp Refills   hydrochlorothiazide (HYDRODIURIL) 25 MG tablet [Pharmacy Med Name: HYDROCHLOROTHIAZIDE 25 MG TAB] 90 tablet 0    Sig: TAKE 1 TABLET (25 MG TOTAL) BY MOUTH DAILY.     Cardiovascular: Diuretics - Thiazide Failed - 12/06/2023 10:44 AM      Failed - Last BP in normal range    BP Readings from Last 1 Encounters:  07/29/23 (!) 145/85         Passed - Cr in normal range and within 180 days    Creatinine, Ser  Date Value Ref Range Status  06/24/2023 0.67 0.57 - 1.00 mg/dL Final         Passed - K in normal range and within 180 days    Potassium  Date Value Ref Range Status  06/24/2023 3.6 3.5 - 5.2 mmol/L Final         Passed - Na in normal range and within 180 days    Sodium  Date Value Ref Range Status  06/24/2023 142 134 - 144 mmol/L Final         Passed - Valid encounter within last 6 months    Recent Outpatient Visits           4 months ago Essential hypertension   [facility] [place] [facility] [place]. [facility] Drucilla Chalet, RPH-CPP   5 months ago Establishing care with new doctor, encounter for   [facility] [place]. [facility] Marcine Matar, MD

## 2023-12-27 ENCOUNTER — Other Ambulatory Visit: Payer: Self-pay | Admitting: Internal Medicine

## 2023-12-27 DIAGNOSIS — Z13820 Encounter for screening for osteoporosis: Secondary | ICD-10-CM

## 2024-01-15 ENCOUNTER — Other Ambulatory Visit: Payer: Self-pay | Admitting: Internal Medicine

## 2024-01-29 ENCOUNTER — Other Ambulatory Visit: Payer: Self-pay | Admitting: Internal Medicine

## 2024-07-14 ENCOUNTER — Other Ambulatory Visit: Payer: Self-pay | Admitting: Internal Medicine

## 2024-07-14 DIAGNOSIS — Z1231 Encounter for screening mammogram for malignant neoplasm of breast: Secondary | ICD-10-CM

## 2024-08-12 ENCOUNTER — Ambulatory Visit

## 2024-08-19 ENCOUNTER — Ambulatory Visit
Admission: RE | Admit: 2024-08-19 | Discharge: 2024-08-19 | Disposition: A | Discharge status: Home / Self Care | Source: Ambulatory Visit | Attending: Internal Medicine | Admitting: Internal Medicine

## 2024-08-19 DIAGNOSIS — Z1231 Encounter for screening mammogram for malignant neoplasm of breast: Secondary | ICD-10-CM

## 2024-08-25 ENCOUNTER — Other Ambulatory Visit: Payer: Self-pay

## 2024-08-27 ENCOUNTER — Ambulatory Visit (HOSPITAL_BASED_OUTPATIENT_CLINIC_OR_DEPARTMENT_OTHER)
Admission: RE | Admit: 2024-08-27 | Discharge: 2024-08-27 | Disposition: A | Discharge status: Home / Self Care | Payer: Self-pay | Source: Ambulatory Visit | Attending: Internal Medicine | Admitting: Internal Medicine

## 2024-08-27 DIAGNOSIS — Z13820 Encounter for screening for osteoporosis: Secondary | ICD-10-CM | POA: Diagnosis present
